# Patient Record
Sex: Male | Born: 1985 | Race: White | Hispanic: No | Marital: Single | Smoking: Former smoker
Health system: Southern US, Community
[De-identification: ages and names within clinical notes are randomized; demographics above are authoritative.]

## PROBLEM LIST (undated history)

## (undated) HISTORY — PX: MOLE REMOVAL: SHX2046

## (undated) HISTORY — PX: ULNAR NERVE REPAIR: SHX2594

---

## 2012-03-20 ENCOUNTER — Ambulatory Visit (INDEPENDENT_AMBULATORY_CARE_PROVIDER_SITE_OTHER): Payer: BC Managed Care – PPO | Admitting: Family Medicine

## 2012-03-20 ENCOUNTER — Ambulatory Visit: Payer: BC Managed Care – PPO

## 2012-03-20 VITALS — BP 118/70 | HR 66 | Temp 98.1°F | Resp 16 | Ht 74.5 in | Wt 173.2 lb

## 2012-03-20 DIAGNOSIS — R209 Unspecified disturbances of skin sensation: Secondary | ICD-10-CM

## 2012-03-20 DIAGNOSIS — R202 Paresthesia of skin: Secondary | ICD-10-CM

## 2012-03-20 DIAGNOSIS — R200 Anesthesia of skin: Secondary | ICD-10-CM

## 2012-03-20 DIAGNOSIS — M25519 Pain in unspecified shoulder: Secondary | ICD-10-CM

## 2012-03-20 MED ORDER — MELOXICAM 7.5 MG PO TABS: 7.5000 mg | ORAL_TABLET | Freq: Every day | ORAL | Status: AC

## 2012-03-20 NOTE — Progress Notes (Signed)
[facility]:  Office Visit  Chief Complaint:  Chief Complaint  Patient presents with  . Extremity Weakness    weakness in right arm and fingers.  Been going on for three to four weeks.  previous hx of same sx in left arm dx of pinched nerve    HPI: Dustin Lin is a 26 y.o. male who complains of  1 month history of intermittent numbness and tingling in all right  fingers and some pain in hand. Denies weakness. Denies  trauma. No repetitive work. He is a Curator. He feels it sometimes in his elbow. Has had more frequency of numbness/tingling when do overhead arm lifting with right shoulder. Has not tried anything. Pertinent h/o idiopathic ulnar nerve palsy.   History reviewed. No pertinent past medical history. Past Surgical History  Procedure Date  . Ulnar nerve repair    History   Social History  . Marital Status: Single    Spouse Name: N/A    Number of Children: N/A  . Years of Education: N/A   Social History Main Topics  . Smoking status: Current Everyday Smoker    Types: Cigarettes  . Smokeless tobacco: None   Comment: 4 to 5 per day  . Alcohol Use: Yes  . Drug Use: None  . Sexually Active: None   Other Topics Concern  . None   Social History Narrative  . None   No family history on file. No Known Allergies Prior to Admission medications   Not on File     ROS: The patient denies fevers, chills, night sweats, unintentional weight loss, chest pain, palpitations, wheezing, dyspnea on exertion, nausea, vomiting, abdominal pain, dysuria, hematuria, melena,  Weakness, +numbness, tingling.  All other systems have been reviewed and were otherwise negative with the exception of those mentioned in the HPI and as above.    PHYSICAL EXAM: Filed Vitals:   03/20/12 1124  BP: 118/70  Pulse: 66  Temp: 98.1 F (36.7 C)  Resp: 16   Filed Vitals:   03/20/12 1124  Height: 6' 2.5" (1.892 m)  Weight: 173 lb 3.2 oz (78.563 kg)   Body mass index is  21.94 kg/(m^2).  General: Alert, no acute distress HEENT:  Normocephalic, atraumatic, oropharynx patent.  Cardiovascular:  Regular rate and rhythm, no rubs murmurs or gallops.  No Carotid bruits, radial pulse intact. No pedal edema.  Respiratory: Clear to auscultation bilaterally.  No wheezes, rales, or rhonchi.  No cyanosis, no use of accessory musculature GI: No organomegaly, abdomen is soft and non-tender, positive bowel sounds.  No masses. Skin: No rashes. Neurologic: Facial musculature symmetric. +significant  atrophy of the dorsum thenar aspect of thumb (Left  abductor pollicis msk)  Psychiatric: Patient is appropriate throughout our interaction. Lymphatic: No cervical lymphadenopathy Musculoskeletal: Gait intact. Neck: nl ROM, strength, nontender, neg Spurling Right Shoulder:  ? Duplication of numbness/tingling with internal and external rotation of shoulder, nl ROM, 5/5 strength, DTR intact, sensation intact,  Right arm/hand/wrist: Normal ROM, nl neurovascular exam   LABS: No results found for this or any previous visit.   EKG/XRAY:   Primary read interpreted by Dr. Conley Rolls at [facility] Normal Xray of shoulder   ASSESSMENT/PLAN: Encounter Diagnoses  Name Primary?  . Numbness and tingling in right hand Yes  . Shoulder pain    Due to history of idiopathic left ulnar nerve palsy will get nerve conduction study Mobic 7.5 mg daily with food Defer medrol dose pack for now Refer to neurology for  nerve conduction study   Elic Vencill PHUONG, DO 03/20/2012 2:09 PM

## 2012-03-22 ENCOUNTER — Telehealth: Payer: Self-pay | Admitting: Family Medicine

## 2012-03-22 NOTE — Telephone Encounter (Signed)
Left message that patient should get a cervical spine xray at our office. I should have done it in the first place but did not do it and after talking to a specialist about his h/o idiopathic ulnar nerve palsy in his left hand it was recommended that I get a c-spine xray to complete the workup in addition to the nerve conduction study I was planning to get anyway.   Left message for patient to come in anytime this week and just get c-spine Xray. Order is in the computer. He does not need to have office visit. He just needs xray. If there are any problems with the order please call me directly at [phone].

## 2012-03-27 ENCOUNTER — Ambulatory Visit (INDEPENDENT_AMBULATORY_CARE_PROVIDER_SITE_OTHER): Payer: BC Managed Care – PPO | Admitting: Physician Assistant

## 2012-03-27 ENCOUNTER — Ambulatory Visit: Payer: BC Managed Care – PPO

## 2012-03-27 DIAGNOSIS — R200 Anesthesia of skin: Secondary | ICD-10-CM

## 2012-03-27 DIAGNOSIS — R209 Unspecified disturbances of skin sensation: Secondary | ICD-10-CM

## 2012-03-27 DIAGNOSIS — R202 Paresthesia of skin: Secondary | ICD-10-CM

## 2012-03-27 NOTE — Progress Notes (Signed)
Patient here for C-spine plain film only. See Dr. Irwin Brakeman phone note. Has already ordered this study. Per Dr. Irwin Brakeman request an OV not needed today. Dr. Conley Rolls to follow up with study.

## 2012-03-27 NOTE — Progress Notes (Signed)
Addended by: Sheldon Silvan on: 03/27/2012 11:58 AM   Modules accepted: Orders

## 2012-03-30 ENCOUNTER — Telehealth: Payer: Self-pay | Admitting: Family Medicine

## 2012-03-30 NOTE — Telephone Encounter (Signed)
Left message that c-spine was normal.

## 2016-02-24 ENCOUNTER — Ambulatory Visit (INDEPENDENT_AMBULATORY_CARE_PROVIDER_SITE_OTHER): Payer: BLUE CROSS/BLUE SHIELD | Admitting: Physician Assistant

## 2016-02-24 VITALS — BP 120/70 | HR 69 | Temp 97.7°F | Resp 17 | Ht 74.0 in | Wt 180.0 lb

## 2016-02-24 DIAGNOSIS — R599 Enlarged lymph nodes, unspecified: Secondary | ICD-10-CM

## 2016-02-24 DIAGNOSIS — R591 Generalized enlarged lymph nodes: Secondary | ICD-10-CM

## 2016-02-24 LAB — POCT CBC
GRANULOCYTE PERCENT: 60.5 % (ref 37–80)
HCT, POC: 45.2 % (ref 43.5–53.7)
Hemoglobin: 16.3 g/dL (ref 14.1–18.1)
Lymph, poc: 1.5 (ref 0.6–3.4)
MCH: 30.9 pg (ref 27–31.2)
MCHC: 36 g/dL — AB (ref 31.8–35.4)
MCV: 85.8 fL (ref 80–97)
MID (cbc): 0.5 (ref 0–0.9)
MPV: 7.3 fL (ref 0–99.8)
PLATELET COUNT, POC: 215 10*3/uL (ref 142–424)
POC Granulocyte: 3.1 (ref 2–6.9)
POC LYMPH %: 29.3 % (ref 10–50)
POC MID %: 10.2 %M (ref 0–12)
RBC: 5.27 M/uL (ref 4.69–6.13)
RDW, POC: 12.8 %
WBC: 5.1 10*3/uL (ref 4.6–10.2)

## 2016-02-24 LAB — HIV ANTIBODY (ROUTINE TESTING W REFLEX): HIV: NONREACTIVE

## 2016-02-24 NOTE — Patient Instructions (Addendum)
     IF you received an x-ray today, you will receive an invoice from [facility]. Please contact [facility] at [phone] with questions or concerns regarding your invoice.   IF you received labwork today, you will receive an invoice from [facility]. Please contact [facility] at [phone] with questions or concerns regarding your invoice.   Our billing staff will not be able to assist you with questions regarding bills from these companies.  You will be contacted with the lab results as soon as they are available. The fastest way to get your results is to activate your My Chart account. Instructions are located on the last page of this paperwork. If you have not heard from us regarding the results in 2 weeks, please contact this office.    Your blood work looks very normal at this time.  This may be nothing, but we will go ahead with an ultrasound.  Please await this contact.  For right now, you can take tylenol or ibuprofen for pain as needed.

## 2016-02-24 NOTE — Progress Notes (Signed)
[facility] [address], [place] [id number] [phone]  Date:  02/24/2016   Name:  Dustin CornGregory Heming   DOB:  September 09, 1986   MRN:  [id number]  PCP:  No primary care provider on file.    History of Present Illness:  Dustin CornGregory Pangilinan is a 30 y.o. male patient who [facility] of lump on neck.  About 6 months ago, he has noticed a lump on the right side of neck.  Muscle soreness from time to time.  There is soreness to the area.  No erythema or drainage.  No fatigue.  No weight loss.  No night sweats or coughing.  He has had no known UR symptoms.    Exercise: bikes, run, and lifting.     Hx of wisdom teeth present however does not hurt or caused any issues for him.  There are no active problems to display for this patient.   No past medical history on file.  Past Surgical History  Procedure Laterality Date  . Ulnar nerve repair      Social History  Substance Use Topics  . Smoking status: Former Smoker    Types: Cigarettes  . Smokeless tobacco: None     Comment: 4 to 5 per day  . Alcohol Use: 1.2 - 1.8 oz/week    2-3 Standard drinks or equivalent per week    No family history on file.  No Known Allergies  Medication list has been reviewed and updated.  No current outpatient prescriptions on file prior to visit.   No current facility-administered medications on file prior to visit.    ROS ROS otherwise unremarkable unless listed above.   Physical Examination: BP 120/70 mmHg  Pulse 69  Temp(Src) 97.7 F (36.5 C) (Oral)  Resp 17  Ht 6\' 2"  (1.88 m)  Wt 180 lb (81.647 kg)  BMI 23.10 kg/m2  SpO2 99% Ideal Body Weight: Weight in (lb) to have BMI = 25: 194.3  Physical Exam  Constitutional: He is oriented to person, place, and time. He appears well-developed and well-nourished. No distress.  HENT:  Head: Normocephalic and atraumatic.  Mouth/Throat: No oropharyngeal exudate, posterior oropharyngeal edema or posterior oropharyngeal  erythema.  Eyes: Conjunctivae and EOM are normal. Pupils are equal, round, and reactive to light.  Cardiovascular: Normal rate.   Pulmonary/Chest: Effort normal. No respiratory distress.  Lymphadenopathy:       Head (right side): No submandibular and no tonsillar adenopathy present.       Head (left side): Submandibular adenopathy present. No tonsillar adenopathy present.  Rubbery lymph node minimally tender at the tonsillar/submandibular site   Neurological: He is alert and oriented to person, place, and time.  Skin: Skin is warm and dry. He is not diaphoretic.  Psychiatric: He has a normal mood and affect. His behavior is normal.     Assessment and Plan: Dustin Lin is a 30 y.o. male who is here today for swollen lymph node This has had a known presence for the last 6 months.  We will proceed to ultrasound though this may be benign.  i will not treat at this time.  Will await US results Swollen lymph nodes - Plan: POCT CBC, HIV antibody, US SOFT TISSUE NECK  Trena PlattStephanie Kjuan Seipp, PA-C [facility] 02/24/2016 10:23 AM

## 2016-04-02 ENCOUNTER — Other Ambulatory Visit: Payer: Self-pay

## 2016-04-04 ENCOUNTER — Ambulatory Visit
Admission: RE | Admit: 2016-04-04 | Discharge: 2016-04-04 | Disposition: A | Discharge status: Home / Self Care | Payer: BLUE CROSS/BLUE SHIELD | Source: Ambulatory Visit | Attending: Physician Assistant | Admitting: Physician Assistant

## 2017-10-26 IMAGING — US US SOFT TISSUE HEAD/NECK
1 series · 14 of 20 positions shown · non-contrast
Comparison: None.

CLINICAL DATA: 30-year-old male with a history of swollen lymph
node.

EXAM:
ULTRASOUND OF HEAD/NECK SOFT TISSUES
TECHNIQUE: Ultrasound examination of the head and neck soft tissues was
performed in the area of clinical concern.

[Series 1: us soft tissue head/neck · 0.06mm/px · 14 of 20 slices shown]
[im 1/20]
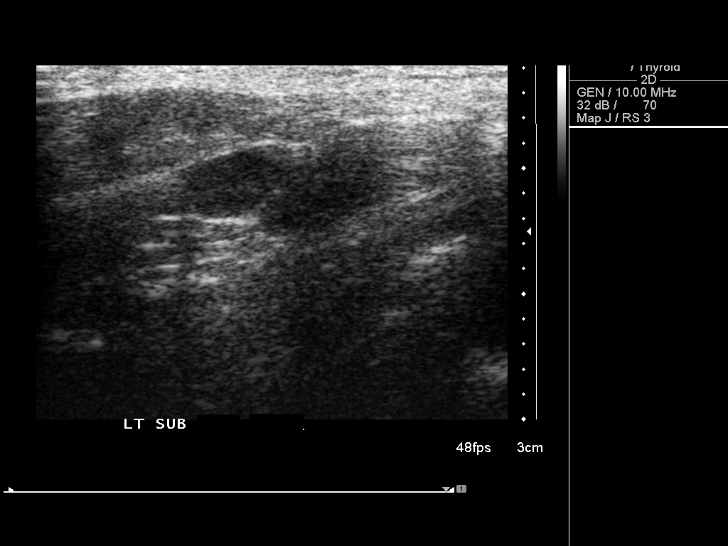
[im 3/20]
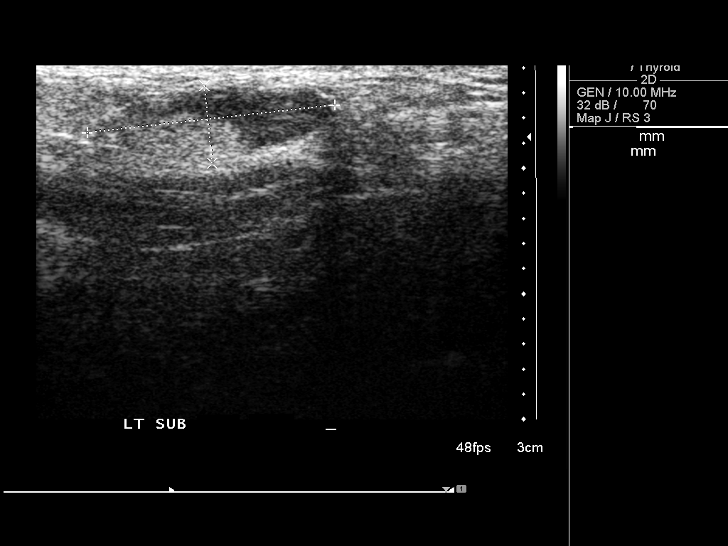
[im 4/20]
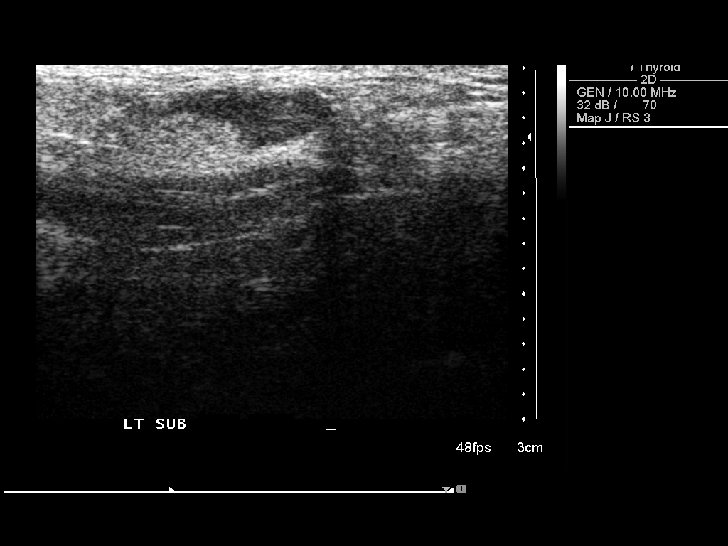
[im 6/20]
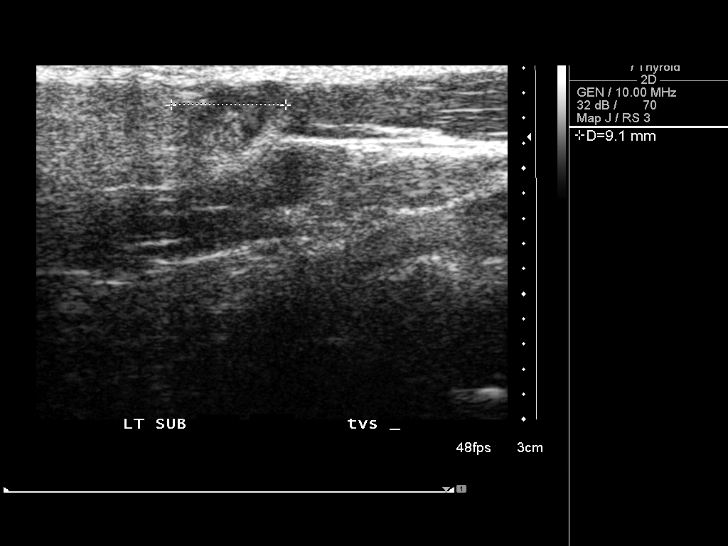
[im 7/20]
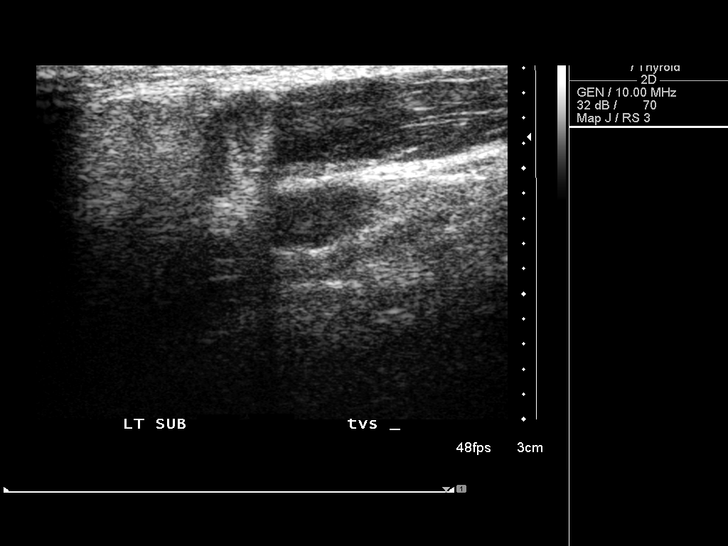
[im 8/20]
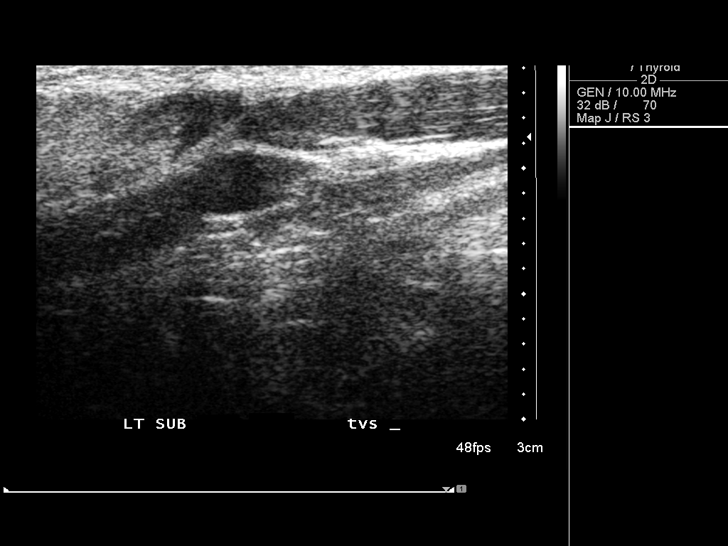
[im 10/20]
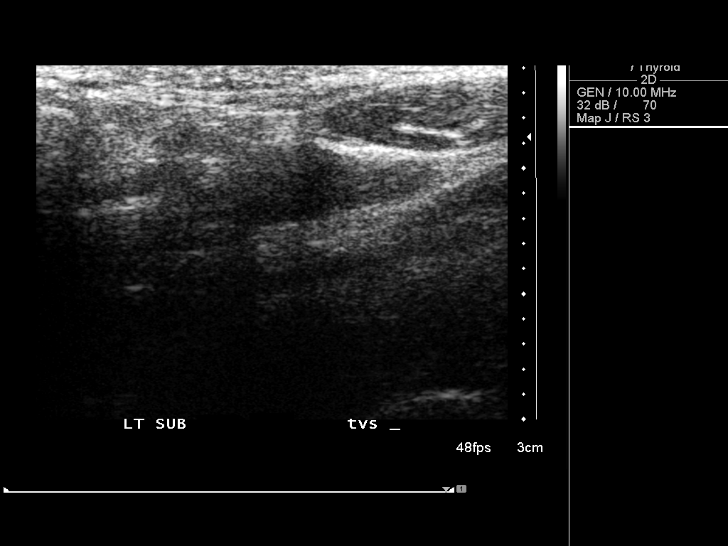
[im 11/20]
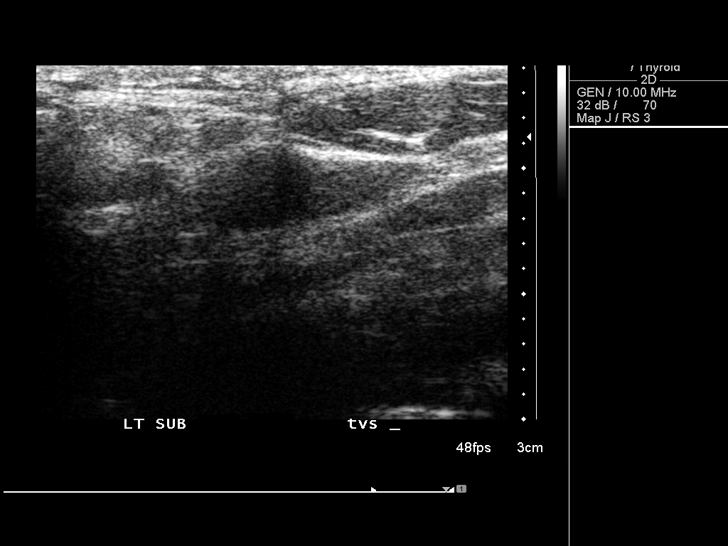
[im 13/20]
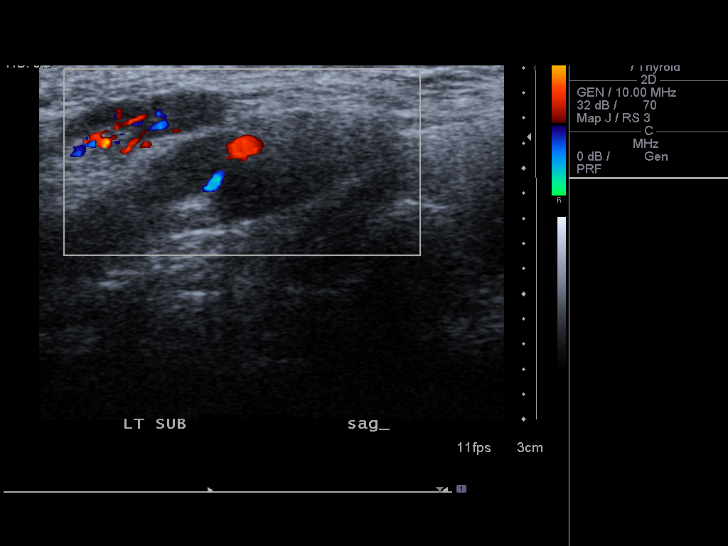
[im 14/20]
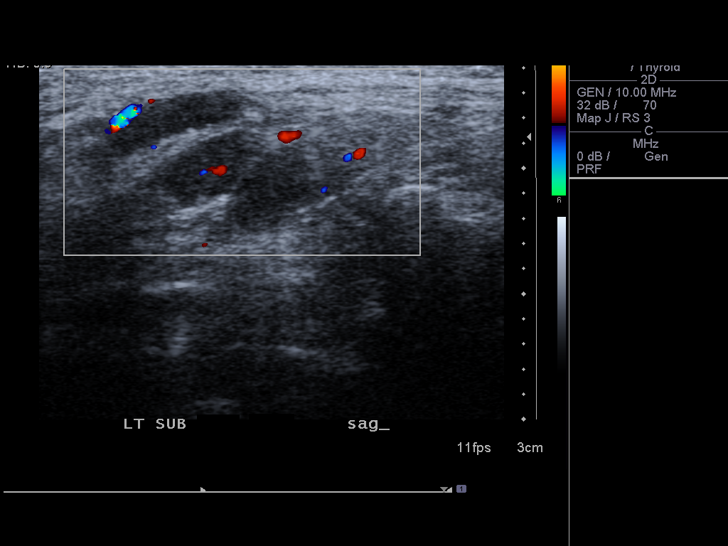
[im 16/20]
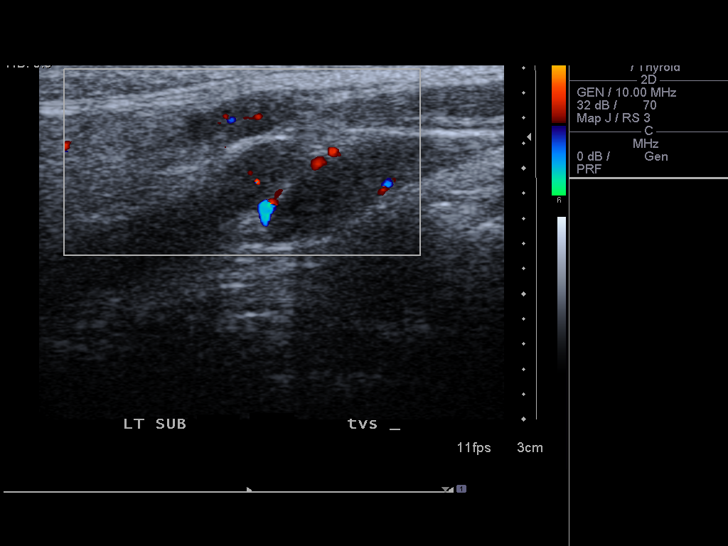
[im 17/20]
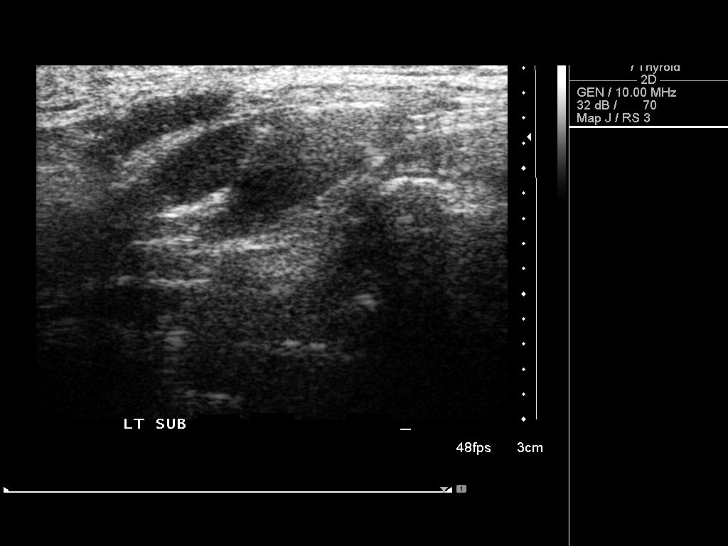
[im 18/20]
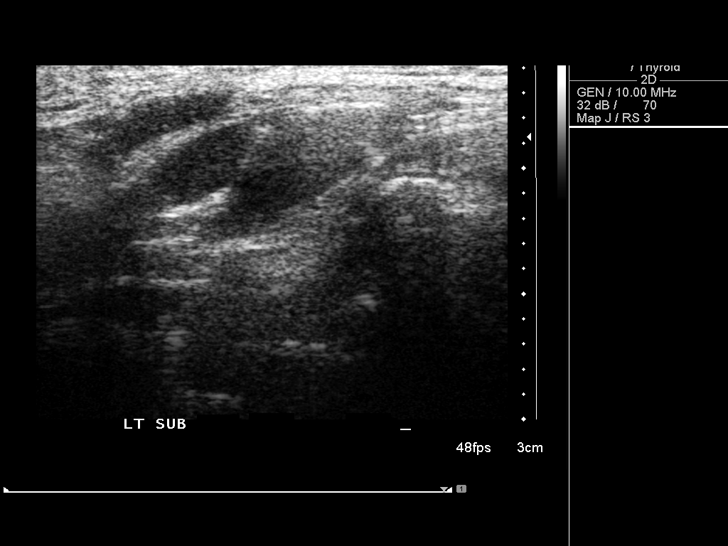
[im 20/20]
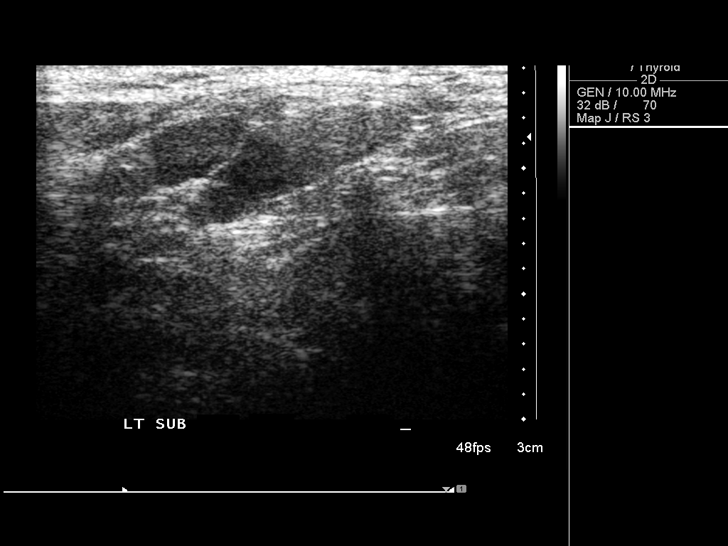

[14 of 20 positions shown; findings below may reference images not displayed]

FINDINGS: Directed duplex with grayscale and color duplex performed in the
region of clinical concern in the left neck.

Hypoechoic soft tissue nodule with hyperechoic hilum measures 2.4 cm
x 1.1 cm x 1.7 cm. Adjacent soft tissue measures 2.0 cm x 0.6 cm x
0.9 cm. Marginal color flow.
IMPRESSION: Directed duplex in the region of clinical concern in the left neck
demonstrates adjacent soft tissue nodules, which are most likely
lymph nodes. The largest is borderline enlarged with short axis of
1.1 cm. Findings are nonspecific, and may represent reactive lymph
nodes. If there is concern for alternative etiology, such as in the
setting of a known malignancy, further evaluation would be
recommended with contrast-enhanced neck CT.

## 2023-05-10 ENCOUNTER — Emergency Department (HOSPITAL_BASED_OUTPATIENT_CLINIC_OR_DEPARTMENT_OTHER)
Admission: EM | Admit: 2023-05-10 | Discharge: 2023-05-10 | Disposition: A | Discharge status: Home / Self Care | Payer: 59 | Attending: Emergency Medicine | Admitting: Emergency Medicine

## 2023-05-10 ENCOUNTER — Encounter (HOSPITAL_BASED_OUTPATIENT_CLINIC_OR_DEPARTMENT_OTHER): Payer: Self-pay | Admitting: Emergency Medicine

## 2023-05-10 ENCOUNTER — Other Ambulatory Visit: Payer: Self-pay

## 2023-05-10 DIAGNOSIS — Y69 Unspecified misadventure during surgical and medical care: Secondary | ICD-10-CM | POA: Diagnosis not present

## 2023-05-10 DIAGNOSIS — T8131XA Disruption of external operation (surgical) wound, not elsewhere classified, initial encounter: Secondary | ICD-10-CM | POA: Insufficient documentation

## 2023-05-10 DIAGNOSIS — T8130XA Disruption of wound, unspecified, initial encounter: Secondary | ICD-10-CM

## 2023-05-10 MED ORDER — DOXYCYCLINE HYCLATE 100 MG PO CAPS: 100.0000 mg | ORAL_CAPSULE | Freq: Two times a day (BID) | ORAL | 0 refills | Status: AC

## 2023-05-10 NOTE — ED Provider Notes (Signed)
[place] [facility] AT [facility] Provider Note   CSN: [id number] Arrival date & time: 05/10/23  1546     History  Chief Complaint  Patient presents with   Wound Check    Dustin Lin is a 37 y.o. male.  HPI     37yo male presents with concern for mole removal wound opening up.  Had mole removal 2 weeks ago, stiches removed Thursday. Sat up and wound opened.   Has several other areas of skin removal without concerns.  3 hours ago sat up and this happened. Did have bleeding initially> Urgent care sent him to the ED.   UTD tetanus Home Medications Prior to Admission medications   Medication Sig Start Date End Date Taking? Authorizing Provider  doxycycline (VIBRAMYCIN) 100 MG capsule Take 1 capsule (100 mg total) by mouth 2 (two) times daily for 10 days. 05/10/23 05/20/23 Yes Alvira Monday, MD      Allergies    Patient has no known allergies.    Review of Systems   Review of Systems  Physical Exam Updated Vital Signs BP (!) 144/98 (BP Location: Left Arm)   Pulse 71   Temp 98.1 F (36.7 C)   Resp 16   SpO2 98%  Physical Exam Vitals and nursing note reviewed.  Constitutional:      General: He is not in acute distress.    Appearance: Normal appearance. He is not ill-appearing, toxic-appearing or diaphoretic.  HENT:     Head: Normocephalic.  Eyes:     Conjunctiva/sclera: Conjunctivae normal.  Cardiovascular:     Rate and Rhythm: Normal rate and regular rhythm.     Pulses: Normal pulses.  Pulmonary:     Effort: Pulmonary effort is normal. No respiratory distress.  Musculoskeletal:        General: No deformity or signs of injury.     Cervical back: No rigidity.  Skin:    General: Skin is warm and dry.     Coloration: Skin is not jaundiced or pale.     Comments: 2cm x 1.5 elliptical wound No surrounding erythema  Neurological:     General: No focal deficit present.     Mental Status: He is alert and oriented to person, place, and  time.     ED Results / Procedures / Treatments   Labs (all labs ordered are listed, but only abnormal results are displayed) Labs Reviewed - No data to display  EKG None  Radiology No results found.  Procedures Procedures    Medications Ordered in ED Medications - No data to display  ED Course/ Medical Decision Making/ A&P                             Medical Decision Making  37yo male presents with concern for mole removal wound opening up.  Surgical wound with dehiscence, no sign of underlying infection.  Do feel prophylaxis is appropriate however given presence of dehiscence.  Discussed options of closure with higher risk of infection however potentially shorter healing time versus more typical allowing secondary closure of dehisced wound which may take more time however decreased infection risk.  He does think he can have someone come by for BID dressing changes. Recommend wet-dry dressings, and provided number for wound care follow up. Given rx for prophylactic doxycycline.          Final Clinical Impression(s) / ED Diagnoses Final diagnoses:  Wound dehiscence  Rx / DC Orders ED Discharge Orders          Ordered    doxycycline (VIBRAMYCIN) 100 MG capsule  2 times daily        05/10/23 1633              Alvira Monday, MD 05/10/23 1644

## 2023-05-10 NOTE — ED Triage Notes (Signed)
Surgerical wound on back open Mole removed 2 weeks ago, stitches removed Thursday Wound opened when sitting up
